# Patient Record
Sex: Female | Born: 1990 | Race: Black or African American | Hispanic: No | Marital: Single | Smoking: Never smoker
Health system: Southern US, Community
[De-identification: ages and names within clinical notes are randomized; demographics above are authoritative.]

---

## 2014-06-11 ENCOUNTER — Emergency Department (HOSPITAL_COMMUNITY)
Admission: EM | Admit: 2014-06-11 | Discharge: 2014-06-11 | Disposition: A | Discharge status: Home / Self Care | Payer: Self-pay | Attending: Emergency Medicine | Admitting: Emergency Medicine

## 2014-06-11 ENCOUNTER — Emergency Department (HOSPITAL_COMMUNITY): Payer: Self-pay

## 2014-06-11 ENCOUNTER — Encounter (HOSPITAL_COMMUNITY): Payer: Self-pay | Admitting: Emergency Medicine

## 2014-06-11 DIAGNOSIS — T148 Other injury of unspecified body region: Secondary | ICD-10-CM | POA: Insufficient documentation

## 2014-06-11 DIAGNOSIS — Y998 Other external cause status: Secondary | ICD-10-CM | POA: Insufficient documentation

## 2014-06-11 DIAGNOSIS — Y9289 Other specified places as the place of occurrence of the external cause: Secondary | ICD-10-CM | POA: Insufficient documentation

## 2014-06-11 DIAGNOSIS — S0083XA Contusion of other part of head, initial encounter: Secondary | ICD-10-CM | POA: Insufficient documentation

## 2014-06-11 DIAGNOSIS — Y9389 Activity, other specified: Secondary | ICD-10-CM | POA: Insufficient documentation

## 2014-06-11 MED ORDER — OXYCODONE-ACETAMINOPHEN 5-325 MG PO TABS: 1.0000 | ORAL_TABLET | ORAL | Status: AC | PRN

## 2014-06-11 MED ORDER — IBUPROFEN 800 MG PO TABS: 800.0000 mg | ORAL_TABLET | Freq: Three times a day (TID) | ORAL | Status: AC

## 2014-06-11 MED ORDER — OXYCODONE-ACETAMINOPHEN 5-325 MG PO TABS
1.0000 | ORAL_TABLET | Freq: Once | ORAL | Status: AC
  Administered 2014-06-11: 1 via ORAL
  Filled 2014-06-11: qty 1
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
## 2014-06-11 NOTE — Discharge Instructions (Signed)
Assault, General Assault includes any behavior, whether intentional or reckless, which results in bodily injury to another person and/or damage to property. Included in this would be any behavior, intentional or reckless, that by its nature would be understood (interpreted) by a reasonable person as intent to harm another person or to damage his/her property. Threats may be oral or written. They may be communicated through regular mail, computer, fax, or phone. These threats may be direct or implied. FORMS OF ASSAULT INCLUDE:  Physically assaulting a person. This includes physical threats to inflict physical harm as well as:  Slapping.  Hitting.  Poking.  Kicking.  Punching.  Pushing.  Arson.  Sabotage.  Equipment vandalism.  Damaging or destroying property.  Throwing or hitting objects.  Displaying a weapon or an object that appears to be a weapon in a threatening manner.  Carrying a firearm of any kind.  Using a weapon to harm someone.  Using greater physical size/strength to intimidate another.  Making intimidating or threatening gestures.  Bullying.  Hazing.  Intimidating, threatening, hostile, or abusive language directed toward another person.  It communicates the intention to engage in violence against that person. And it leads a reasonable person to expect that violent behavior may occur.  Stalking another person. IF IT HAPPENS AGAIN:  Immediately call for emergency help (911 in [place]  If someone poses clear and immediate danger to you, seek legal authorities to have a protective or restraining order put in place.  Less threatening assaults can at least be reported to authorities. STEPS TO TAKE IF A SEXUAL ASSAULT HAS HAPPENED  Go to an area of safety. This may include a shelter or staying with a friend. Stay away from the area where you have been attacked. A large percentage of sexual assaults are caused by a friend, relative or associate.  If  medications were given by your caregiver, take them as directed for the full length of time prescribed.  Only take over-the-counter or prescription medicines for pain, discomfort, or fever as directed by your caregiver.  If you have come in contact with a sexual disease, find out if you are to be tested again. If your caregiver is concerned about the HIV/AIDS virus, he/she may require you to have continued testing for several months.  For the protection of your privacy, test results can not be given over the phone. Make sure you receive the results of your test. If your test results are not back during your visit, make an appointment with your caregiver to find out the results. Do not assume everything is normal if you have not heard from your caregiver or the medical facility. It is important for you to follow up on all of your test results.  File appropriate papers with authorities. This is important in all assaults, even if it has occurred in a family or by a friend. SEEK MEDICAL CARE IF:  You have new problems because of your injuries.  You have problems that may be because of the medicine you are taking, such as:  Rash.  Itching.  Swelling.  Trouble breathing.  You develop belly (abdominal) pain, feel sick to your stomach (nausea) or are vomiting.  You begin to run a temperature.  You need supportive care or referral to a rape crisis center. These are centers with trained personnel who can help you get through this ordeal. SEEK IMMEDIATE MEDICAL CARE IF:  You are afraid of being threatened, beaten, or abused. In [place] call 911.  You  receive new injuries related to abuse.  You develop severe pain in any area injured in the assault or have any change in your condition that concerns you.  You faint or lose consciousness.  You develop chest pain or shortness of breath. Document Released: 05/12/2005 Document Revised: 08/04/2011 Document Reviewed: 12/29/2007 [employer] Patient  Information 2015 [place], [place]. This information is not intended to replace advice given to you by your health care provider. Make sure you discuss any questions you have with your health care provider.  Contusion A contusion is a deep bruise. Contusions happen when an injury causes bleeding under the skin. Signs of bruising include pain, puffiness (swelling), and discolored skin. The contusion may turn blue, purple, or yellow. HOME CARE   Put ice on the injured area.  Put ice in a plastic bag.  Place a towel between your skin and the bag.  Leave the ice on for 15-20 minutes, 03-04 times a day.  Only take medicine as told by your doctor.  Rest the injured area.  If possible, raise (elevate) the injured area to lessen puffiness. GET HELP RIGHT AWAY IF:   You have more bruising or puffiness.  You have pain that is getting worse.  Your puffiness or pain is not helped by medicine. MAKE SURE YOU:   Understand these instructions.  Will watch your condition.  Will get help right away if you are not doing well or get worse. Document Released: 10/29/2007 Document Revised: 08/04/2011 Document Reviewed: 03/17/2011 [facility] Patient Information 2015 [place], [place]. This information is not intended to replace advice given to you by your health care provider. Make sure you discuss any questions you have with your health care provider. Cryotherapy Cryotherapy means treatment with cold. Ice or gel packs can be used to reduce both pain and swelling. Ice is the most helpful within the first 24 to 48 hours after an injury or flare-up from overusing a muscle or joint. Sprains, strains, spasms, burning pain, shooting pain, and aches can all be eased with ice. Ice can also be used when recovering from surgery. Ice is effective, has very few side effects, and is safe for most people to use. PRECAUTIONS  Ice is not a safe treatment option for people with:  Raynaud phenomenon. This is a condition  affecting small blood vessels in the extremities. Exposure to cold may cause your problems to return.  Cold hypersensitivity. There are many forms of cold hypersensitivity, including:  Cold urticaria. Red, itchy hives appear on the skin when the tissues begin to warm after being iced.  Cold erythema. This is a red, itchy rash caused by exposure to cold.  Cold hemoglobinuria. Red blood cells break down when the tissues begin to warm after being iced. The hemoglobin that carry oxygen are passed into the urine because they cannot combine with blood proteins fast enough.  Numbness or altered sensitivity in the area being iced. If you have any of the following conditions, do not use ice until you have discussed cryotherapy with your caregiver:  Heart conditions, such as arrhythmia, angina, or chronic heart disease.  High blood pressure.  Healing wounds or open skin in the area being iced.  Current infections.  Rheumatoid arthritis.  Poor circulation.  Diabetes. Ice slows the blood flow in the region it is applied. This is beneficial when trying to stop inflamed tissues from spreading irritating chemicals to surrounding tissues. However, if you expose your skin to cold temperatures for too long or without the proper protection,  you can damage your skin or nerves. Watch for signs of skin damage due to cold. HOME CARE INSTRUCTIONS Follow these tips to use ice and cold packs safely.  Place a dry or damp towel between the ice and skin. A damp towel will cool the skin more quickly, so you may need to shorten the time that the ice is used.  For a more rapid response, add gentle compression to the ice.  Ice for no more than 10 to 20 minutes at a time. The bonier the area you are icing, the less time it will take to get the benefits of ice.  Check your skin after 5 minutes to make sure there are no signs of a poor response to cold or skin damage.  Rest 20 minutes or more between uses.  Once  your skin is numb, you can end your treatment. You can test numbness by very lightly touching your skin. The touch should be so light that you do not see the skin dimple from the pressure of your fingertip. When using ice, most people will feel these normal sensations in this order: cold, burning, aching, and numbness.  Do not use ice on someone who cannot communicate their responses to pain, such as small children or people with dementia. HOW TO MAKE AN ICE PACK Ice packs are the most common way to use ice therapy. Other methods include ice massage, ice baths, and cryosprays. Muscle creams that cause a cold, tingly feeling do not offer the same benefits that ice offers and should not be used as a substitute unless recommended by your caregiver. To make an ice pack, do one of the following:  Place crushed ice or a bag of frozen vegetables in a sealable plastic bag. Squeeze out the excess air. Place this bag inside another plastic bag. Slide the bag into a pillowcase or place a damp towel between your skin and the bag.  Mix 3 parts water with 1 part rubbing alcohol. Freeze the mixture in a sealable plastic bag. When you remove the mixture from the freezer, it will be slushy. Squeeze out the excess air. Place this bag inside another plastic bag. Slide the bag into a pillowcase or place a damp towel between your skin and the bag. SEEK MEDICAL CARE IF:  You develop white spots on your skin. This may give the skin a blotchy (mottled) appearance.  Your skin turns blue or pale.  Your skin becomes waxy or hard.  Your swelling gets worse. MAKE SURE YOU:   Understand these instructions.  Will watch your condition.  Will get help right away if you are not doing well or get worse. Document Released: 01/06/2011 Document Revised: 09/26/2013 Document Reviewed: 01/06/2011 [facility] Patient Information 2015 [place]. This information is not intended to replace advice given to you by your health care  provider. Make sure you discuss any questions you have with your health care provider.
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
